# Patient Record
Sex: Female | Born: 1985 | Race: Black or African American | Hispanic: No | Marital: Single | Smoking: Current every day smoker
Health system: Southern US, Community
[De-identification: ages and names within clinical notes are randomized; demographics above are authoritative.]

## PROBLEM LIST (undated history)

## (undated) HISTORY — PX: ABDOMINAL HYSTERECTOMY: SHX81

---

## 2017-06-22 ENCOUNTER — Emergency Department (HOSPITAL_COMMUNITY)
Admission: EM | Admit: 2017-06-22 | Discharge: 2017-06-22 | Disposition: A | Discharge status: Home / Self Care | Payer: Self-pay | Attending: Emergency Medicine | Admitting: Emergency Medicine

## 2017-06-22 ENCOUNTER — Encounter (HOSPITAL_COMMUNITY): Payer: Self-pay

## 2017-06-22 ENCOUNTER — Other Ambulatory Visit: Payer: Self-pay

## 2017-06-22 DIAGNOSIS — F17210 Nicotine dependence, cigarettes, uncomplicated: Secondary | ICD-10-CM | POA: Insufficient documentation

## 2017-06-22 DIAGNOSIS — B9689 Other specified bacterial agents as the cause of diseases classified elsewhere: Secondary | ICD-10-CM

## 2017-06-22 DIAGNOSIS — N760 Acute vaginitis: Secondary | ICD-10-CM | POA: Insufficient documentation

## 2017-06-22 DIAGNOSIS — Y929 Unspecified place or not applicable: Secondary | ICD-10-CM | POA: Insufficient documentation

## 2017-06-22 DIAGNOSIS — Y939 Activity, unspecified: Secondary | ICD-10-CM | POA: Insufficient documentation

## 2017-06-22 DIAGNOSIS — Y999 Unspecified external cause status: Secondary | ICD-10-CM | POA: Insufficient documentation

## 2017-06-22 DIAGNOSIS — X58XXXA Exposure to other specified factors, initial encounter: Secondary | ICD-10-CM | POA: Insufficient documentation

## 2017-06-22 DIAGNOSIS — S76211A Strain of adductor muscle, fascia and tendon of right thigh, initial encounter: Secondary | ICD-10-CM | POA: Insufficient documentation

## 2017-06-22 LAB — WET PREP, GENITAL
Sperm: NONE SEEN
Trich, Wet Prep: NONE SEEN
Yeast Wet Prep HPF POC: NONE SEEN

## 2017-06-22 MED ORDER — METRONIDAZOLE 500 MG PO TABS: 500.0000 mg | ORAL_TABLET | Freq: Two times a day (BID) | ORAL | 0 refills | Status: AC

## 2017-06-22 NOTE — ED Triage Notes (Signed)
Per Pt, Pt has hx of hysterectomy 6 years ago. Reports that she has a dropped bladder and she is recently having the pain continue and get worse. Denies blood in her urine or burning when she pain. Reports some vaginal discharge.

## 2017-06-22 NOTE — ED Provider Notes (Addendum)
[facility] Provider Note   CSN: [id number] Arrival date & time: 06/22/17  1837     History   Chief Complaint Chief Complaint  Patient presents with  . Pelvic Pain    HPI Ezzard StandingDeonna Nakama is a 31 y.o. female.  HPI  Patient is a 31 year old female with a history of abdominal hysterectomy and reported prolapsed bladder who presents to the ED complaining of right-sided pelvic pain that began yesterday while she was walking at work.  Patient states that she has no pain when she is at rest, however when she walks, moves, or turns wrong she has sharp 8/10 pain.  She denies any difficulty walking.  Patient also reports a brownish discharge for the last few months, and a vaginal odor for the last few weeks.  She has chronic stress incontinence but denies any denies any fevers, nausea, vomiting, abdominal pain, diarrhea, constipation hematuria, urinary frequency, hematuria, vaginal bleeding, vaginal irritation, or rashes.  She is currently sexually active.  She has had multiple partners recently.  She denies condom use with her current partner.  History reviewed. No pertinent past medical history.  There are no active problems to display for this patient.   Past Surgical History:  Procedure Laterality Date  . ABDOMINAL HYSTERECTOMY      OB History    No data available       Home Medications    Prior to Admission medications   Medication Sig Start Date End Date Taking? Authorizing Provider  metroNIDAZOLE (FLAGYL) 500 MG tablet Take 1 tablet (500 mg total) by mouth 2 (two) times daily. 06/22/17   Mordecai Tindol S, PA-C    Family History No family history on file.  Social History Social History   Tobacco Use  . Smoking status: Current Every Day Smoker    Packs/day: 0.50    Types: Cigarettes  . Smokeless tobacco: Never Used  Substance Use Topics  . Alcohol use: Yes  . Drug use: No     Allergies   Shellfish allergy   Review  of Systems Review of Systems  Constitutional: Negative for chills and fever.  HENT: Negative for ear pain and sore throat.   Eyes: Negative for pain and visual disturbance.  Respiratory: Negative for cough and shortness of breath.   Cardiovascular: Negative for chest pain and palpitations.  Gastrointestinal: Negative for abdominal pain, constipation, diarrhea, nausea and vomiting.  Genitourinary: Positive for pelvic pain and vaginal discharge. Negative for dysuria, frequency, hematuria, urgency, vaginal bleeding and vaginal pain.  Musculoskeletal: Negative for arthralgias and back pain.  Skin: Negative for color change and rash.  Neurological: Negative for seizures and syncope.  All other systems reviewed and are negative.    Physical Exam Updated Vital Signs BP (!) 143/87 (BP Location: Left Arm)   Pulse 89   Temp 98.7 F (37.1 C) (Oral)   Resp 16   Ht 5\' 4"  (1.626 m)   SpO2 98%   Physical Exam  Constitutional: She appears well-developed and well-nourished. No distress.  HENT:  Head: Normocephalic and atraumatic.  Eyes: Conjunctivae are normal.  Neck: Neck supple.  Cardiovascular: Normal rate and regular rhythm.  No murmur heard. Pulmonary/Chest: Effort normal and breath sounds normal. No respiratory distress.  Abdominal: Soft. Bowel sounds are normal. There is no tenderness.  Genitourinary: Vagina normal.  Genitourinary Comments: Cervix not visualized.  Mild amount of white discharge to the vaginal wall.  No erythema or lesions to the labia or vaginal vault.  No tenderness with bimanual exam.  Musculoskeletal: She exhibits no edema.  Patient has 5 out of 5 strength to the bilateral lower extremities.  Patient has full range of motion.  Ambulatory with a slight limp in the room, this elicits her pain.  Neurological: She is alert.  Skin: Skin is warm and dry.  Psychiatric: She has a normal mood and affect.  Nursing note and vitals reviewed.    ED Treatments / Results    Labs (all labs ordered are listed, but only abnormal results are displayed) Labs Reviewed  WET PREP, GENITAL - Abnormal; Notable for the following components:      Result Value   Clue Cells Wet Prep HPF POC PRESENT (*)    WBC, Wet Prep HPF POC MODERATE (*)    All other components within normal limits  GC/CHLAMYDIA PROBE AMP ([place] NOT [facility]    EKG  EKG Interpretation None       Radiology No results found.  Procedures Procedures (including critical care time)  Medications Ordered in ED Medications - No data to display   Initial Impression / Assessment and Plan / ED Course  I have reviewed the triage vital signs and the nursing notes.  Pertinent labs & imaging results that were available during my care of the patient were reviewed by me and considered in my medical decision making (see chart for details).     Rechecked patient.  Completed pelvic exam with chaperone in the room.   Recheck patient.  Discussed the results of her wet prep that I will send her home with antibiotics.  Advised her not to drink while taking Flagyl.  Advised to take these until the course is complete.  Discussed that her GC/chlamydia will not return for about 24 hours and we will contact her if it is positive.  Discussed that I have low concern for this.  Discussed the plan for discharge outpatient follow-up within 1 week.  Return precautions given.  Patient understands and agrees.  Final Clinical Impressions(s) / ED Diagnoses   Final diagnoses:  BV (bacterial vaginosis)  Groin strain, right, initial encounter   Patient with right groin pain present only with movement.  Likely muscle strain.  No difficulty with ambulation, and no neurovascular concerns.  Recommended treatment with NSAIDs and rest.  Patient also reported brown discharge and vaginal odor.  Results of wet prep showed white blood cells and clue cells.  Will treat with Flagyl. Gc/chlamydia pending and pt aware. Return  precautions given prior to dc.  ED Discharge Orders        Ordered    metroNIDAZOLE (FLAGYL) 500 MG tablet  2 times daily     06/22/17 2116       Rayne DuCouture, Tiandra Swoveland S, PA-C 06/22/17 2136    Shaune PollackIsaacs, Cameron, MD 06/22/17 2254    Karrie Meresouture, Mahamed Zalewski S, PA-C 06/23/17 [id number]    Shaune PollackIsaacs, Cameron, MD 06/24/17 [phone]

## 2017-06-22 NOTE — Discharge Instructions (Signed)
You may take Tylenol and ibuprofen to relieve your muscle symptoms.  You may also use warm and cold compresses for this pain.  You are also given an antibiotic, please take this until it is finished. Please return to the ER if you experience any numbness, weakness, or any new or worsening symptoms.

## 2017-06-23 LAB — GC/CHLAMYDIA PROBE AMP (~~LOC~~) NOT AT ARMC
Chlamydia: NEGATIVE
Neisseria Gonorrhea: NEGATIVE

## 2018-01-06 ENCOUNTER — Emergency Department (HOSPITAL_COMMUNITY): Payer: Self-pay

## 2018-01-06 ENCOUNTER — Other Ambulatory Visit: Payer: Self-pay

## 2018-01-06 ENCOUNTER — Emergency Department (HOSPITAL_COMMUNITY)
Admission: EM | Admit: 2018-01-06 | Discharge: 2018-01-06 | Disposition: A | Discharge status: Home / Self Care | Payer: Self-pay | Attending: Emergency Medicine | Admitting: Emergency Medicine

## 2018-01-06 DIAGNOSIS — R109 Unspecified abdominal pain: Secondary | ICD-10-CM | POA: Insufficient documentation

## 2018-01-06 DIAGNOSIS — R197 Diarrhea, unspecified: Secondary | ICD-10-CM | POA: Insufficient documentation

## 2018-01-06 DIAGNOSIS — R110 Nausea: Secondary | ICD-10-CM | POA: Insufficient documentation

## 2018-01-06 DIAGNOSIS — F17210 Nicotine dependence, cigarettes, uncomplicated: Secondary | ICD-10-CM | POA: Insufficient documentation

## 2018-01-06 LAB — LIPASE, BLOOD: LIPASE: 26 U/L (ref 11–51)

## 2018-01-06 LAB — CBC WITH DIFFERENTIAL/PLATELET
BASOS ABS: 0 10*3/uL (ref 0.0–0.1)
Basophils Relative: 0 %
Eosinophils Absolute: 0.1 10*3/uL (ref 0.0–0.7)
Eosinophils Relative: 1 %
HCT: 40 % (ref 36.0–46.0)
Hemoglobin: 13.2 g/dL (ref 12.0–15.0)
Lymphocytes Relative: 32 %
Lymphs Abs: 3.1 10*3/uL (ref 0.7–4.0)
MCH: 27.7 pg (ref 26.0–34.0)
MCHC: 33 g/dL (ref 30.0–36.0)
MCV: 84 fL (ref 78.0–100.0)
Monocytes Absolute: 0.5 10*3/uL (ref 0.1–1.0)
Monocytes Relative: 5 %
NEUTROS ABS: 6.2 10*3/uL (ref 1.7–7.7)
NEUTROS PCT: 62 %
PLATELETS: 320 10*3/uL (ref 150–400)
RBC: 4.76 MIL/uL (ref 3.87–5.11)
RDW: 13.5 % (ref 11.5–15.5)
WBC: 10 10*3/uL (ref 4.0–10.5)

## 2018-01-06 LAB — COMPREHENSIVE METABOLIC PANEL
ALBUMIN: 3.6 g/dL (ref 3.5–5.0)
ALT: 14 U/L (ref 0–44)
AST: 16 U/L (ref 15–41)
Alkaline Phosphatase: 48 U/L (ref 38–126)
Anion gap: 9 (ref 5–15)
BILIRUBIN TOTAL: 0.4 mg/dL (ref 0.3–1.2)
BUN: 10 mg/dL (ref 6–20)
CALCIUM: 8.8 mg/dL — AB (ref 8.9–10.3)
CO2: 22 mmol/L (ref 22–32)
Chloride: 108 mmol/L (ref 98–111)
Creatinine, Ser: 0.78 mg/dL (ref 0.44–1.00)
GFR calc Af Amer: >60 mL/min (ref >60)
GLUCOSE: 93 mg/dL (ref 70–99)
POTASSIUM: 3.5 mmol/L (ref 3.5–5.1)
Sodium: 139 mmol/L (ref 135–145)
TOTAL PROTEIN: 6.5 g/dL (ref 6.5–8.1)

## 2018-01-06 LAB — URINALYSIS, ROUTINE W REFLEX MICROSCOPIC
BILIRUBIN URINE: NEGATIVE
Bacteria, UA: NONE SEEN
Glucose, UA: NEGATIVE mg/dL
KETONES UR: NEGATIVE mg/dL
LEUKOCYTES UA: NEGATIVE
NITRITE: NEGATIVE
PH: 5 (ref 5.0–8.0)
Protein, ur: NEGATIVE mg/dL
Specific Gravity, Urine: 1.013 (ref 1.005–1.030)

## 2018-01-06 MED ORDER — SODIUM CHLORIDE 0.9 % IV BOLUS
500.0000 mL | Freq: Once | INTRAVENOUS | Status: AC
  Administered 2018-01-06: 500 mL via INTRAVENOUS

## 2018-01-06 MED ORDER — ONDANSETRON HCL 4 MG/2ML IJ SOLN
4.0000 mg | Freq: Once | INTRAMUSCULAR | Status: AC
  Administered 2018-01-06: 4 mg via INTRAVENOUS
  Filled 2018-01-06: qty 2

## 2018-01-06 MED ORDER — MORPHINE SULFATE (PF) 4 MG/ML IV SOLN
4.0000 mg | Freq: Once | INTRAVENOUS | Status: AC
  Administered 2018-01-06: 4 mg via INTRAVENOUS
  Filled 2018-01-06: qty 1

## 2018-01-06 MED ORDER — HYDROCODONE-ACETAMINOPHEN 5-325 MG PO TABS: 1.0000 | ORAL_TABLET | Freq: Four times a day (QID) | ORAL | 0 refills | Status: AC | PRN

## 2018-01-06 NOTE — Discharge Instructions (Addendum)
Your labs and CT scan are overall reassuring, they do not show any evidence of kidney stone at this time, I think you likely passed a kidney stone within the last day or so, as this pain can persist for a little while afterwards.  The other option is that this could be muscle pain in your back.  Please treat pain with ibuprofen and Tylenol as needed, provided a prescription for small amount of pain medication for any breakthrough pain if you need it.  Return to the emergency department if you develop significantly worsened pain, fevers, pain with urination, persistent vomiting or any other new or concerning symptoms.

## 2018-01-06 NOTE — ED Provider Notes (Signed)
[facility]-EMERGENCY DEPT Provider Note   CSN: [id number] Arrival date & time: 01/06/18  1428     History   Chief Complaint Chief Complaint  Patient presents with  . Flank Pain    right flank/back pain     HPI Mckenzie Edwards is a 32 y.o. female.  Mckenzie Edwards is a 32 y.o. Female who is otherwise healthy, presents to the ED for evaluation of sudden onset right flank pain approximately 2 days ago.  Patient reports a constant dull ache over the right flank with intermittent sharp stabbing pains that radiate towards the groin.  She has never experienced pain like this before, does not have history of kidney stones.  She reports some associated mild nausea but no vomiting.  She does report she has had a few episodes of nonbloody diarrhea, denies any melena.  She denies fevers or chills, no dysuria or urinary frequency, no hematuria.  Patient denies any chest pain or shortness of breath she has not tried anything to treat this pain prior to arrival, denies any other aggravating or alleviating factors.  Aside from hysterectomy, no other abdominal surgeries.  Patient denies any weakness numbness or tingling in lower extremities, no recent strenuous activity or heavy lifting, no history of back injury.     No past medical history on file.  There are no active problems to display for this patient.   Past Surgical History:  Procedure Laterality Date  . ABDOMINAL HYSTERECTOMY       OB History   None      Home Medications    Prior to Admission medications   Medication Sig Start Date End Date Taking? Authorizing Provider  acetaminophen (TYLENOL) 500 MG tablet Take 500-1,000 mg by mouth every 6 (six) hours as needed for moderate pain.   Yes [provider]  HYDROcodone-acetaminophen (NORCO) 5-325 MG tablet Take 1 tablet by mouth every 6 (six) hours as needed. 01/06/18   Dartha Lodge, PA-C  metroNIDAZOLE (FLAGYL) 500 MG tablet Take 1 tablet (500 mg  total) by mouth 2 (two) times daily. Patient not taking: Reported on 01/06/2018 06/22/17   Karrie Meres, PA-C    Family History No family history on file.  Social History Social History   Tobacco Use  . Smoking status: Current Every Day Smoker    Packs/day: 0.50    Types: Cigarettes  . Smokeless tobacco: Never Used  Substance Use Topics  . Alcohol use: Yes  . Drug use: No     Allergies   Shellfish allergy   Review of Systems Review of Systems  Constitutional: Negative for chills and fever.  HENT: Negative.   Eyes: Negative for visual disturbance.  Respiratory: Negative for cough and shortness of breath.   Cardiovascular: Negative for chest pain.  Gastrointestinal: Positive for diarrhea and nausea. Negative for abdominal pain, blood in stool and vomiting.  Genitourinary: Positive for flank pain. Negative for difficulty urinating, dysuria and frequency.  Musculoskeletal: Positive for back pain. Negative for arthralgias, joint swelling and myalgias.  Skin: Negative for color change and rash.  Neurological: Negative for weakness and numbness.     Physical Exam Updated Vital Signs BP 138/90 (BP Location: Right Arm)   Pulse (!) 55   Temp 98 F (36.7 C) (Oral)   Resp 18   SpO2 100%   Physical Exam  Constitutional: She is oriented to person, place, and time. She appears well-developed and well-nourished. No distress.  HENT:  Head: Normocephalic and atraumatic.  Mouth/Throat: Oropharynx is clear and moist.  Eyes: Right eye exhibits no discharge. Left eye exhibits no discharge.  Neck: Neck supple.  Cardiovascular: Normal rate, regular rhythm, normal heart sounds and intact distal pulses.  Pulmonary/Chest: Effort normal and breath sounds normal. No respiratory distress.  Respirations equal and unlabored, patient able to speak in full sentences, lungs clear to auscultation bilaterally  Abdominal: Soft. Bowel sounds are normal. She exhibits no distension and no mass.  There is no tenderness. There is no guarding.  Abdomen soft, nondistended, nontender to palpation in all quadrants without guarding or peritoneal signs, there is some mild CVA tenderness over the right flank  Musculoskeletal:  No midline spinal tenderness  Neurological: She is alert and oriented to person, place, and time. Coordination normal.  Skin: Skin is warm and dry. Capillary refill takes less than 2 seconds. She is not diaphoretic.  Psychiatric: She has a normal mood and affect. Her behavior is normal.  Nursing note and vitals reviewed.    ED Treatments / Results  Labs (all labs ordered are listed, but only abnormal results are displayed) Labs Reviewed  URINALYSIS, ROUTINE W REFLEX MICROSCOPIC - Abnormal; Notable for the following components:      Result Value   Hgb urine dipstick SMALL (*)    All other components within normal limits  COMPREHENSIVE METABOLIC PANEL - Abnormal; Notable for the following components:   Calcium 8.8 (*)    All other components within normal limits  LIPASE, BLOOD  CBC WITH DIFFERENTIAL/PLATELET  POC URINE PREG, ED    EKG None  Radiology Ct Renal Stone Study  Result Date: 01/06/2018 CLINICAL DATA:  RIGHT flank pain radiating to RIGHT back since 01/04/2018, suspected stone disease EXAM: CT ABDOMEN AND PELVIS WITHOUT CONTRAST TECHNIQUE: Multidetector CT imaging of the abdomen and pelvis was performed following the standard protocol without IV contrast. Sagittal and coronal MPR images reconstructed from axial data set. Oral contrast not administered for this indication. COMPARISON:  None FINDINGS: Lower chest: Lung bases clear Hepatobiliary: Contracted gallbladder.  Liver unremarkable. Pancreas: Normal appearance Spleen: Normal appearance Adrenals/Urinary Tract: Adrenal glands, kidneys, ureters, and bladder normal appearance. No definite urinary tract calcification or dilatation. Stomach/Bowel: Normal appendix. Stomach and bowel loops normal appearance.  Vascular/Lymphatic: Minimal atherosclerotic calcifications iliac arteries. Aorta normal caliber. No definite adenopathy. Small pelvic phleboliths. Reproductive: Uterus surgically absent. Nonvisualization of ovaries. Other: No free air or free fluid. Tiny umbilical hernia containing fat. No acute inflammatory process. Musculoskeletal: Unremarkable IMPRESSION: No acute intra-abdominal or intrapelvic abnormalities. Tiny umbilical hernia containing fat. Electronically Signed   By: Ulyses SouthwardMark  Boles M.D.   On: 01/06/2018 17:51    Procedures Procedures (including critical care time)  Medications Ordered in ED Medications  sodium chloride 0.9 % bolus 500 mL (0 mLs Intravenous Stopped 01/06/18 1958)  ondansetron (ZOFRAN) injection 4 mg (4 mg Intravenous Given 01/06/18 1801)  morphine 4 MG/ML injection 4 mg (4 mg Intravenous Given 01/06/18 1801)     Initial Impression / Assessment and Plan / ED Course  I have reviewed the triage vital signs and the nursing notes.  Pertinent labs & imaging results that were available during my care of the patient were reviewed by me and considered in my medical decision making (see chart for details).  Patient presents for evaluation of sudden onset right flank pain with intermittent sharp pains that radiate towards the groin.  No associated fevers or urinary symptoms, some mild nausea and a few episodes of diarrhea, no vomiting.  No prior  history of kidney stones.  Presentation definitely concerning for nephrolithiasis, less likely but also possible musculoskeletal pain, patient has no numbness weakness or tingling in the lower extremities or radicular symptoms.  No concern for cauda equina.  Patient has had a few episodes of diarrhea.  Will get abdominal labs and CT renal stone study, IV fluids, morphine and Zofran for symptomatic management.  Labs reassuring, no leukocytosis, normal hemoglobin, no acute electrolyte derangements, normal renal and liver function and normal lipase,  negative pregnancy.  Urinalysis with a small amount of hemoglobin, no signs of infection.  CT renal stone study shows no acute intra-abdominal or intrapelvic abnormalities, there is a tiny umbilical hernia containing only fat.  Given reassuring work-up I think it is possible the patient recently passed stone, versus musculoskeletal back pain.  Pain has completely resolved with treatment here in the ED.  Discussed these results with the patient at this time I feel she is stable for discharge home, provided small amount of pain medication encouraged to use ibuprofen as well, she is to follow-up with her primary doctor if pain persists.  Strict return precautions discussed.  Patient expresses understanding and is in agreement with plan  Final Clinical Impressions(s) / ED Diagnoses   Final diagnoses:  Right flank pain    ED Discharge Orders        Ordered    HYDROcodone-acetaminophen (NORCO) 5-325 MG tablet  Every 6 hours PRN     01/06/18 1940       Dartha Lodge, PA-C 01/08/18 0136    Arby Barrette, MD 01/09/18 1146

## 2018-01-06 NOTE — ED Triage Notes (Signed)
Pt reports pain in right flank area radiating to right back area starting 01/04/18. Pain comes suddenly and will go away after a while.

## 2018-01-06 NOTE — ED Notes (Signed)
Blood draw attempted, unsuccessful. 

## 2018-05-03 ENCOUNTER — Emergency Department (HOSPITAL_COMMUNITY)
Admission: EM | Admit: 2018-05-03 | Discharge: 2018-05-03 | Disposition: A | Discharge status: Home / Self Care | Payer: Self-pay | Attending: Emergency Medicine | Admitting: Emergency Medicine

## 2018-05-03 ENCOUNTER — Other Ambulatory Visit: Payer: Self-pay

## 2018-05-03 ENCOUNTER — Encounter (HOSPITAL_COMMUNITY): Payer: Self-pay

## 2018-05-03 ENCOUNTER — Emergency Department (HOSPITAL_COMMUNITY): Payer: Self-pay

## 2018-05-03 DIAGNOSIS — Y999 Unspecified external cause status: Secondary | ICD-10-CM | POA: Insufficient documentation

## 2018-05-03 DIAGNOSIS — W010XXA Fall on same level from slipping, tripping and stumbling without subsequent striking against object, initial encounter: Secondary | ICD-10-CM | POA: Insufficient documentation

## 2018-05-03 DIAGNOSIS — S8392XA Sprain of unspecified site of left knee, initial encounter: Secondary | ICD-10-CM | POA: Insufficient documentation

## 2018-05-03 DIAGNOSIS — Y939 Activity, unspecified: Secondary | ICD-10-CM | POA: Insufficient documentation

## 2018-05-03 DIAGNOSIS — S60211A Contusion of right wrist, initial encounter: Secondary | ICD-10-CM | POA: Insufficient documentation

## 2018-05-03 DIAGNOSIS — F17210 Nicotine dependence, cigarettes, uncomplicated: Secondary | ICD-10-CM | POA: Insufficient documentation

## 2018-05-03 DIAGNOSIS — Y929 Unspecified place or not applicable: Secondary | ICD-10-CM | POA: Insufficient documentation

## 2018-05-03 DIAGNOSIS — S63501A Unspecified sprain of right wrist, initial encounter: Secondary | ICD-10-CM

## 2018-05-03 MED ORDER — IBUPROFEN 600 MG PO TABS: 600.0000 mg | ORAL_TABLET | Freq: Four times a day (QID) | ORAL | 0 refills | Status: AC | PRN

## 2018-05-03 MED ORDER — KETOROLAC TROMETHAMINE 15 MG/ML IJ SOLN
30.0000 mg | Freq: Once | INTRAMUSCULAR | Status: AC
  Administered 2018-05-03: 30 mg via INTRAMUSCULAR
  Filled 2018-05-03: qty 2

## 2018-05-03 MED ORDER — TRAMADOL HCL 50 MG PO TABS: 50.0000 mg | ORAL_TABLET | Freq: Four times a day (QID) | ORAL | 0 refills | Status: AC | PRN

## 2018-05-03 NOTE — Discharge Instructions (Signed)
Please ice and elevate the knee as much as possible for several days to reduce pain and swelling Use knee immobilizer and crutches until you can put weight on your left leg without severe pain Take Ibuprofen 600mg  for pain and inflammation for the next week Please follow up with Dr. Jena Gauss (orthopedics) if you are not improving. You may need an MRI of the knee

## 2018-05-03 NOTE — ED Triage Notes (Signed)
Pt states she fell last night and someone fell on top of her. She has pain to the right knee and leg area. She also has pain and bruising to the right hand.

## 2018-05-03 NOTE — ED Provider Notes (Signed)
[facility] EMERGENCY DEPARTMENT Provider Note   CSN: [id number] Arrival date & time: 05/03/18  7829     History   Chief Complaint Chief Complaint  Patient presents with  . Leg Injury    HPI Mckenzie Edwards is a 32 y.o. female who presents with right wrist pain and left knee pain.  No significant past medical history.  The patient states that last night she fell and her friend she states is much bigger than her fell on top of her leg and bent it the wrong way.  Ever since then she is had severe left knee pain radiating down to the left foot.  She reports associated right wrist pain but states this pain is mild and just sore.  Since she has been sitting in the emergency department she states her foot is going numb.  She is never had any injury before.   HPI  History reviewed. No pertinent past medical history.  There are no active problems to display for this patient.   Past Surgical History:  Procedure Laterality Date  . ABDOMINAL HYSTERECTOMY       OB History   None      Home Medications    Prior to Admission medications   Medication Sig Start Date End Date Taking? Authorizing Provider  acetaminophen (TYLENOL) 500 MG tablet Take 500-1,000 mg by mouth every 6 (six) hours as needed for moderate pain.    [provider]  HYDROcodone-acetaminophen (NORCO) 5-325 MG tablet Take 1 tablet by mouth every 6 (six) hours as needed. 01/06/18   Dartha Lodge, PA-C  metroNIDAZOLE (FLAGYL) 500 MG tablet Take 1 tablet (500 mg total) by mouth 2 (two) times daily. Patient not taking: Reported on 01/06/2018 06/22/17   Karrie Meres, PA-C    Family History History reviewed. No pertinent family history.  Social History Social History   Tobacco Use  . Smoking status: Current Every Day Smoker    Packs/day: 0.50    Types: Cigarettes  . Smokeless tobacco: Never Used  Substance Use Topics  . Alcohol use: Yes  . Drug use: No     Allergies     Shellfish allergy   Review of Systems Review of Systems  Musculoskeletal: Positive for arthralgias and joint swelling.  Neurological: Positive for numbness. Negative for weakness.     Physical Exam Updated Vital Signs BP (!) 137/101 (BP Location: Left Arm)   Pulse 89   Temp 99.3 F (37.4 C) (Oral)   Resp 16   SpO2 96%   Physical Exam  Constitutional: She is oriented to person, place, and time. She appears well-developed and well-nourished. She appears distressed.  Tearful and mildly distressed due to pain  HENT:  Head: Normocephalic and atraumatic.  Eyes: Pupils are equal, round, and reactive to light. Conjunctivae are normal. Right eye exhibits no discharge. Left eye exhibits no discharge. No scleral icterus.  Neck: Normal range of motion.  Cardiovascular: Normal rate.  Pulmonary/Chest: Effort normal. No respiratory distress.  Abdominal: She exhibits no distension.  Musculoskeletal:  Left knee: Small joint effusion. Difficult to exam since she is so tender. 2+ DP pulse. Significant pain with any ROM of the knee. When foot is manipulated she has significant pain in the knee as well.    Right wrist: Bruising over volar aspect of wrist. No swelling. No tenderness. FROM. 2+ radial pulse.   Neurological: She is alert and oriented to person, place, and time.  Skin: Skin is warm and dry.  Psychiatric: She has a normal mood and affect. Her behavior is normal.  Nursing note and vitals reviewed.    ED Treatments / Results  Labs (all labs ordered are listed, but only abnormal results are displayed) Labs Reviewed - No data to display  EKG None  Radiology Dg Knee Complete 4 Views Left  Result Date: 05/03/2018 CLINICAL DATA:  Per patient a person fell on her left knee last night. Patient has minimal movement without pain to left knee, that radiates down leg. Patient reports left foot numbness. Per patient inside is more severe than outside. Reports medial swelling. EXAM:  LEFT KNEE - COMPLETE 4+ VIEW COMPARISON:  None. FINDINGS: There is a small joint effusion. No acute fracture or subluxation. No suspicious lytic or blastic lesions are identified. IMPRESSION: Small joint effusion. Electronically Signed   By: Norva Pavlov M.D.   On: 05/03/2018 09:56    Procedures Procedures (including critical care time)  Medications Ordered in ED Medications  ketorolac (TORADOL) 15 MG/ML injection 30 mg (30 mg Intramuscular Given 05/03/18 1117)     Initial Impression / Assessment and Plan / ED Course  I have reviewed the triage vital signs and the nursing notes.  Pertinent labs & imaging results that were available during my care of the patient were reviewed by me and considered in my medical decision making (see chart for details).  32 year old female presents with right wrist and left knee pain after a fall and then another person falling on her last night.  The pain is primarily in her left knee and she is distressed due to the pain.  She is unable to put weight on that side.  She is a small joint effusion.  I am unable to manipulate the knee due to her level of pain.  X-ray shows a small effusion as well.  No bony abnormality.  Suspect ligamentous injury.  Will place a knee immobilizer and provide crutches.  Do not think she needs imaging of the wrist at this time.  We will give prescriptions for ibuprofen and tramadol and encourage follow-up with orthopedics.  Final Clinical Impressions(s) / ED Diagnoses   Final diagnoses:  Sprain of left knee, unspecified ligament, initial encounter  Sprain of right wrist, initial encounter    ED Discharge Orders    None       Bethel Born, PA-C 05/03/18 1146    Raeford Razor, MD 05/03/18 1626

## 2019-06-04 IMAGING — CT CT RENAL STONE PROTOCOL
2 of 4 series · 17 of 46 positions shown, 19 images · non-contrast
Comparison: None

CLINICAL DATA: RIGHT flank pain radiating to RIGHT back since
01/04/2018, suspected stone disease

EXAM:
CT ABDOMEN AND PELVIS WITHOUT CONTRAST
TECHNIQUE: Multidetector CT imaging of the abdomen and pelvis was performed
following the standard protocol without IV contrast. Sagittal and
coronal MPR images reconstructed from axial data set. Oral contrast
not administered for this indication.

[Series 2: axial st · axial · 0.79mm/px · z∈[+1247,+1642]mm · 14 of 89 slices shown, 16 images]
[im 5/89  soft-tissue]
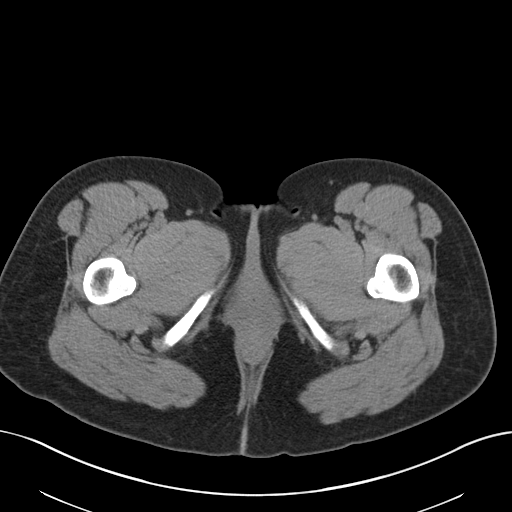
[im 5/89  bone]
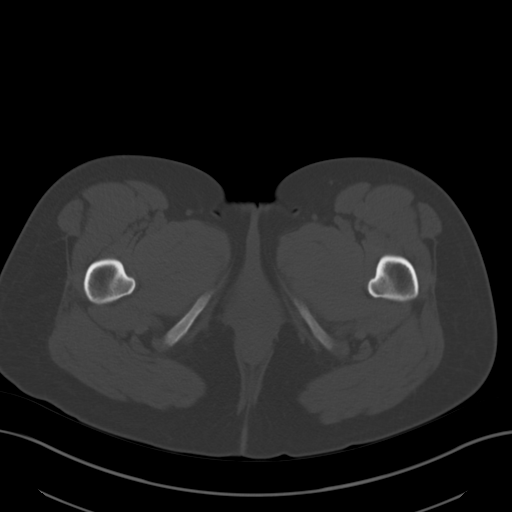
[im 10/89  soft-tissue]
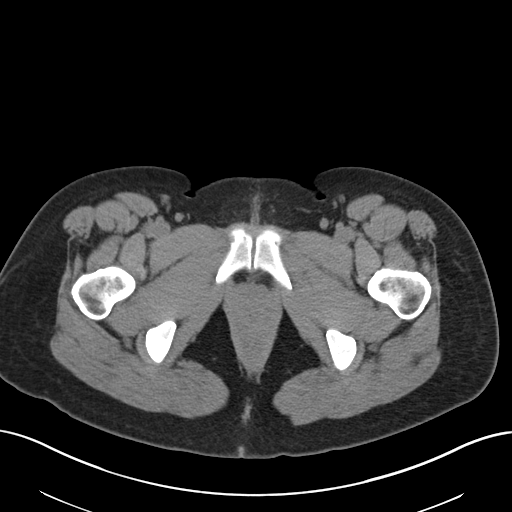
[im 20/89  soft-tissue]
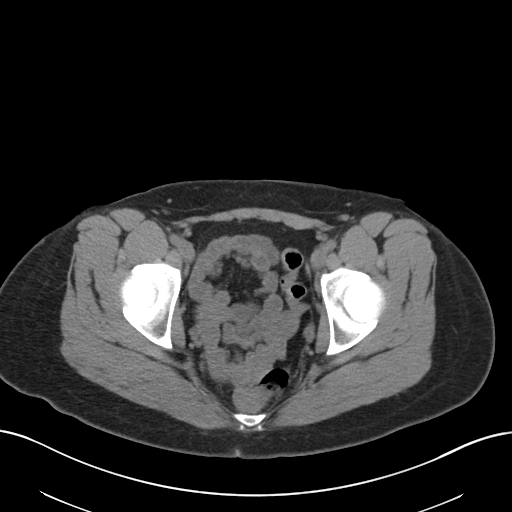
[im 25/89  soft-tissue]
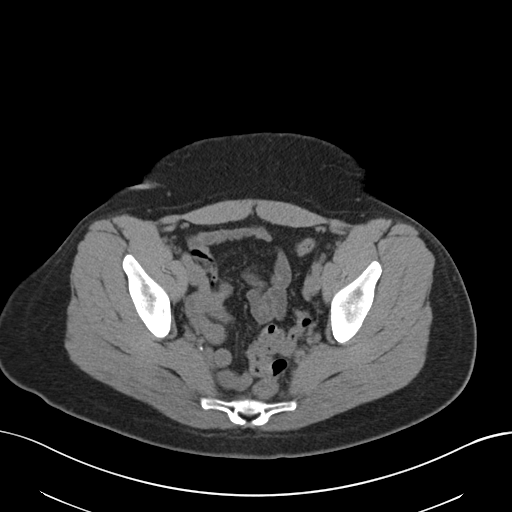
[im 30/89  soft-tissue]
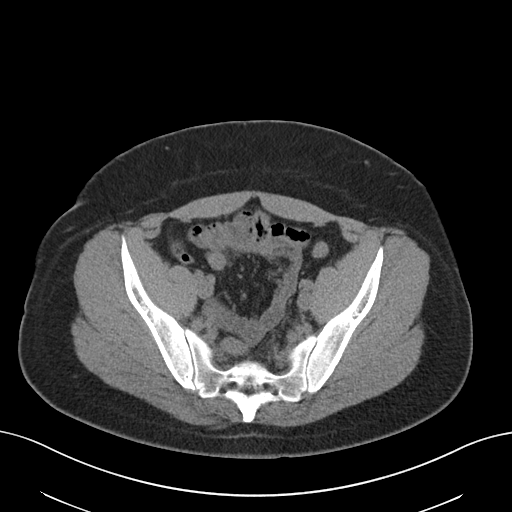
[im 35/89  soft-tissue]
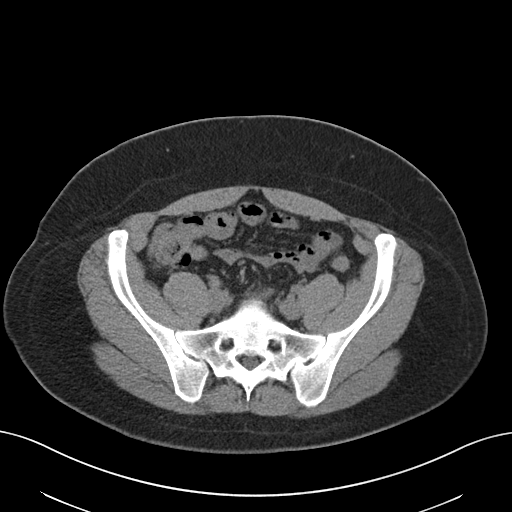
[im 40/89  soft-tissue]
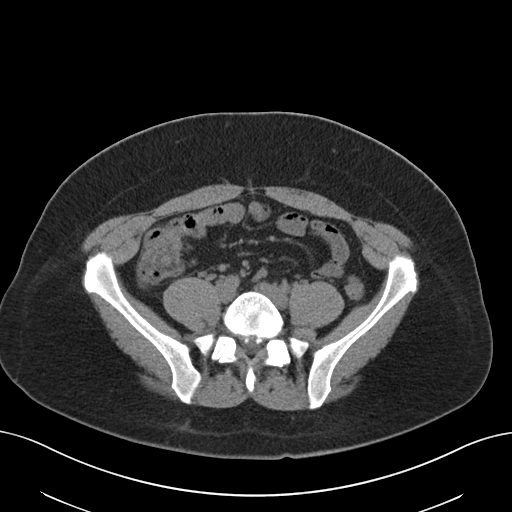
[im 49/89  soft-tissue]
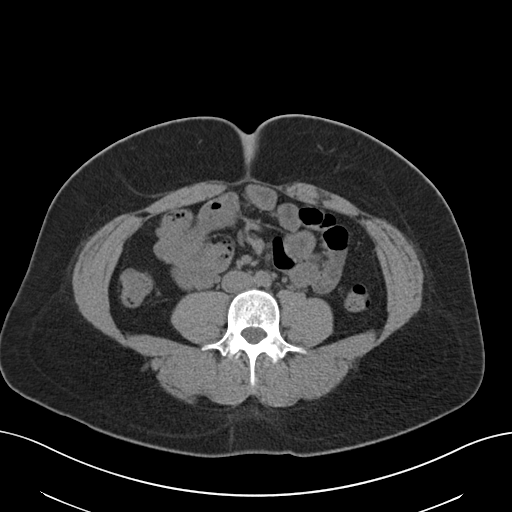
[im 54/89  soft-tissue]
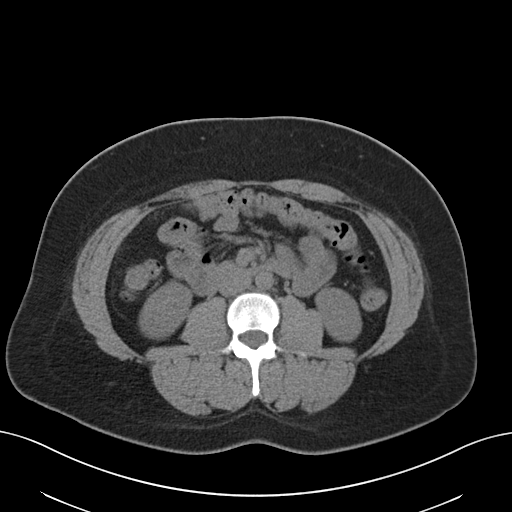
[im 54/89  bone]
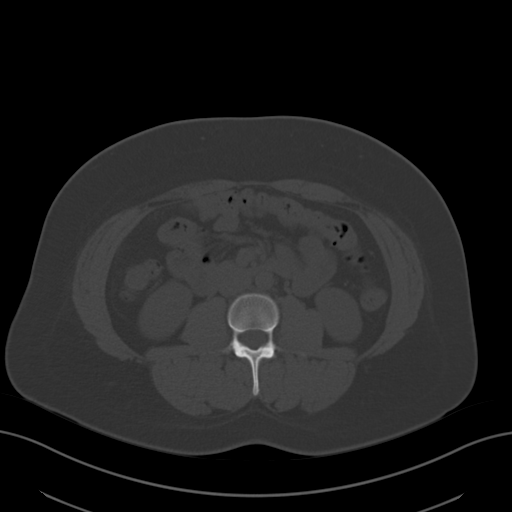
[im 59/89  soft-tissue]
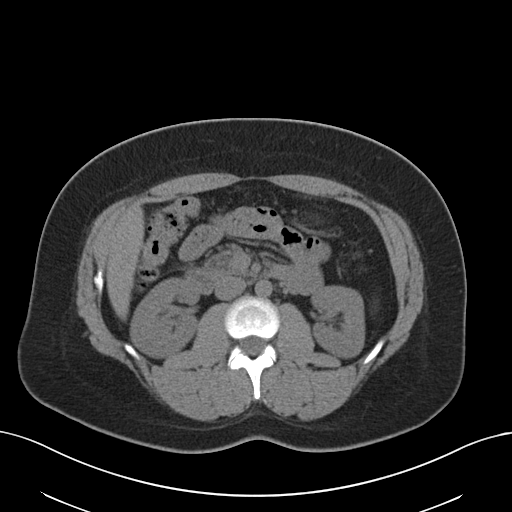
[im 64/89  soft-tissue]
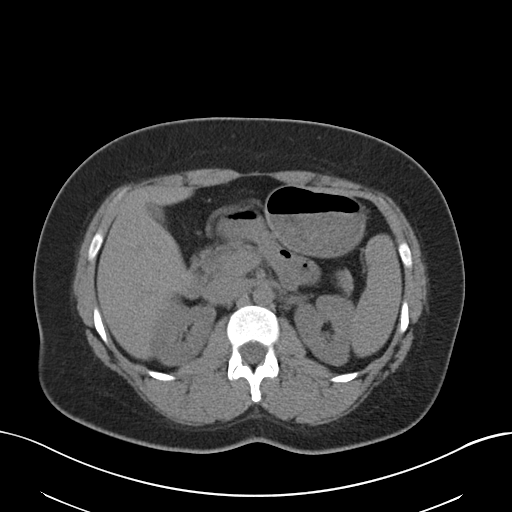
[im 69/89  soft-tissue]
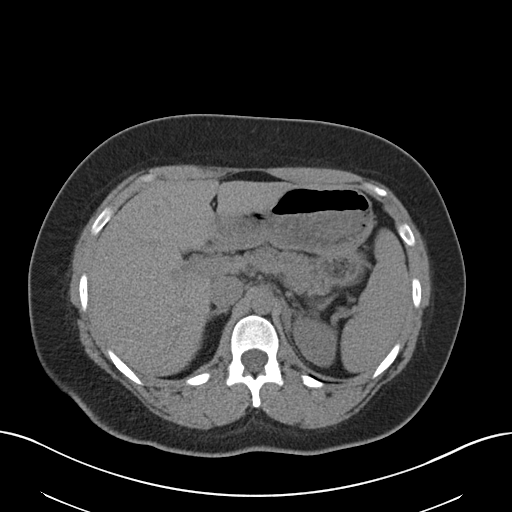
[im 79/89  soft-tissue]
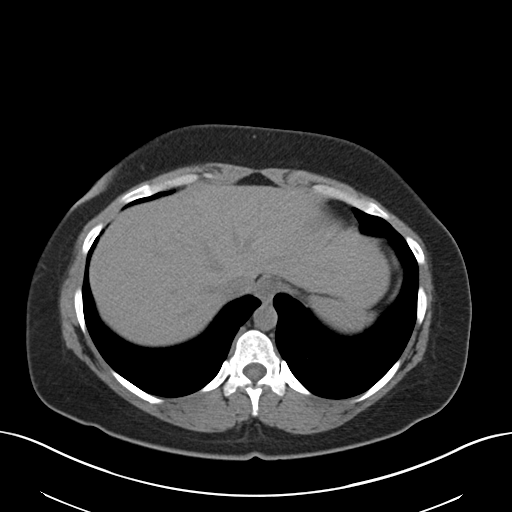
[im 84/89  soft-tissue]
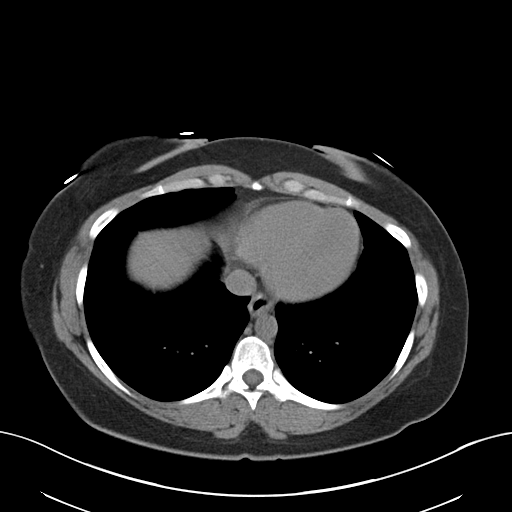

[Series 5: coronal · coronal · 0.75mm/px · 3 of 158 slices shown]
[im 53/158  soft-tissue]
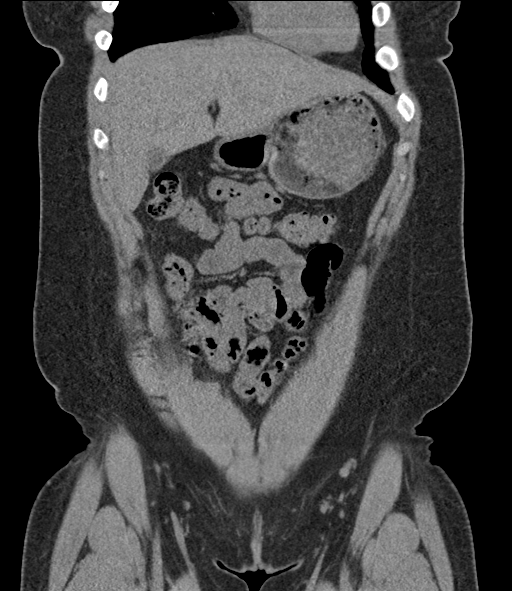
[im 70/158  soft-tissue]
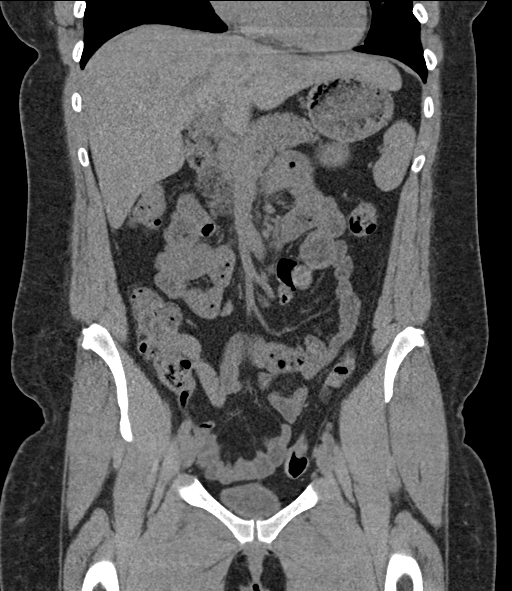
[im 88/158  soft-tissue]
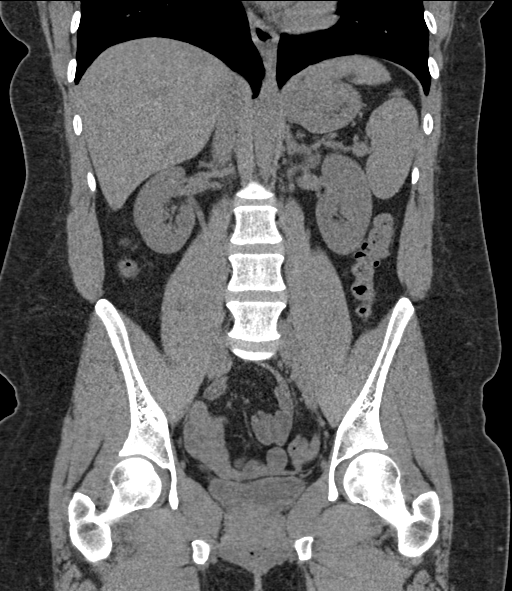

[17 of 46 positions shown; findings below may reference images not displayed]

FINDINGS: Lower chest: Lung bases clear

Hepatobiliary: Contracted gallbladder.  Liver unremarkable.

Pancreas: Normal appearance

Spleen: Normal appearance

Adrenals/Urinary Tract: Adrenal glands, kidneys, ureters, and
bladder normal appearance. No definite urinary tract calcification
or dilatation.

Stomach/Bowel: Normal appendix. Stomach and bowel loops normal
appearance.

Vascular/Lymphatic: Minimal atherosclerotic calcifications iliac
arteries. Aorta normal caliber. No definite adenopathy. Small pelvic
phleboliths.

Reproductive: Uterus surgically absent. Nonvisualization of ovaries.

Other: No free air or free fluid. Tiny umbilical hernia containing
fat. No acute inflammatory process.

Musculoskeletal: Unremarkable
IMPRESSION: No acute intra-abdominal or intrapelvic abnormalities.

Tiny umbilical hernia containing fat.
